# Patient Record
Sex: Male | Born: 1970 | Race: Black or African American | Hispanic: No | Marital: Single | Smoking: Current every day smoker
Health system: Southern US, Community
[De-identification: ages and names within clinical notes are randomized; demographics above are authoritative.]

## PROBLEM LIST (undated history)

## (undated) DIAGNOSIS — F39 Unspecified mood [affective] disorder: Secondary | ICD-10-CM

## (undated) DIAGNOSIS — Q8740 Marfan's syndrome, unspecified: Secondary | ICD-10-CM

## (undated) DIAGNOSIS — F319 Bipolar disorder, unspecified: Secondary | ICD-10-CM

## (undated) DIAGNOSIS — K219 Gastro-esophageal reflux disease without esophagitis: Secondary | ICD-10-CM

## (undated) DIAGNOSIS — F909 Attention-deficit hyperactivity disorder, unspecified type: Secondary | ICD-10-CM

## (undated) HISTORY — DX: Marfan syndrome, unspecified: Q87.40

## (undated) HISTORY — DX: Unspecified mood (affective) disorder: F39

## (undated) HISTORY — DX: Gastro-esophageal reflux disease without esophagitis: K21.9

## (undated) HISTORY — DX: Bipolar disorder, unspecified: F31.9

## (undated) HISTORY — DX: Attention-deficit hyperactivity disorder, unspecified type: F90.9

---
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
## 2005-09-15 ENCOUNTER — Ambulatory Visit (HOSPITAL_COMMUNITY): Admission: RE | Admit: 2005-09-15 | Discharge: 2005-09-15 | Payer: Self-pay | Admitting: Internal Medicine

## 2006-06-15 IMAGING — CR DG HIP W/ PELVIS BILAT
6 series · 6 of 6 positions shown · non-contrast
Comparison: none

HISTORY: Bilateral hip pain, left hip popping, history left hip replacement for
slipped capital femoral epiphysis

BILATERAL HIPS WITH AP PELVIS:
Status post left total hip replacement.
No fracture, dislocation, or bone destruction.
Mineralization normal.
Minimal periprosthetic lucency surrounding tip of femoral component, question
loosening or infection
Soft tissues unremarkable.

[view not recorded (1 of 6)]
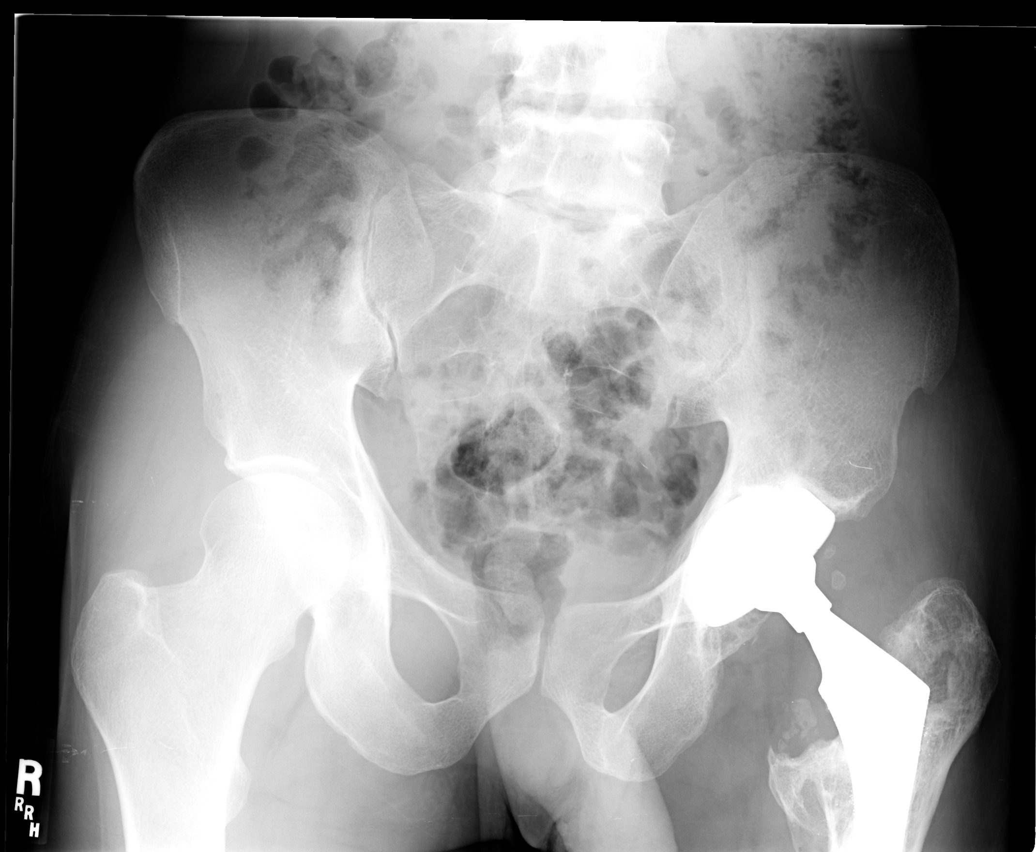

[view not recorded (2 of 6)]
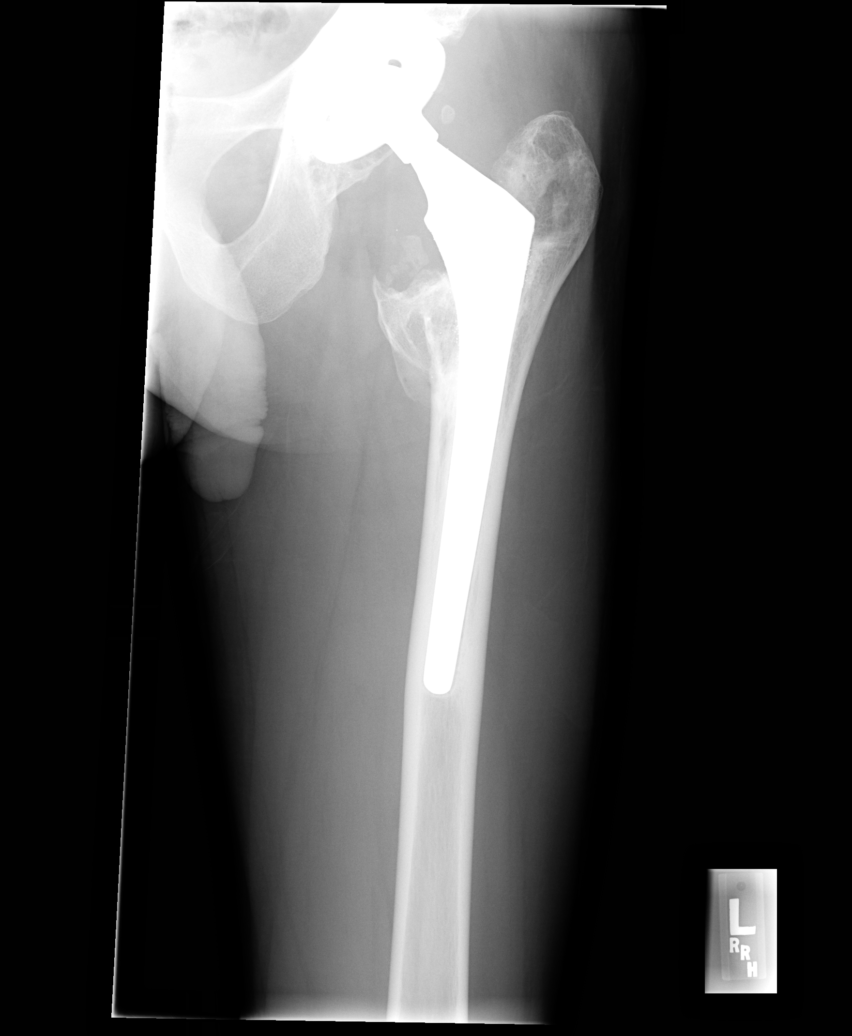

[view not recorded (3 of 6)]
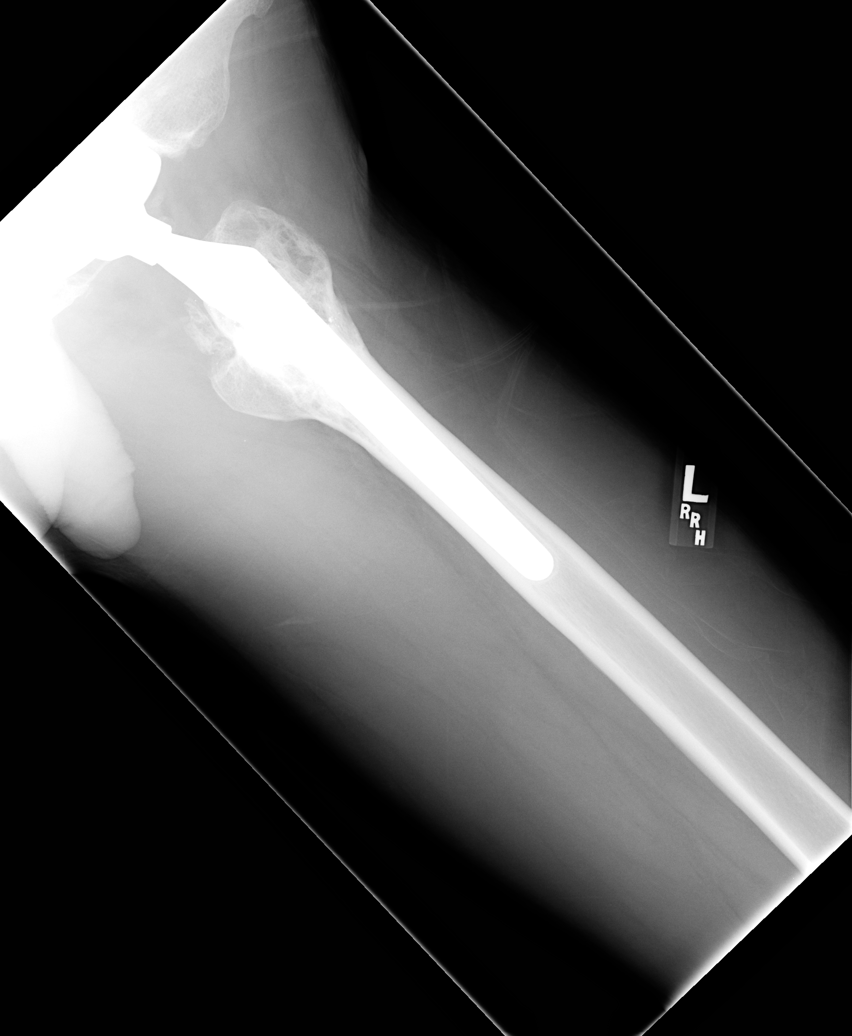

[view not recorded (4 of 6)]
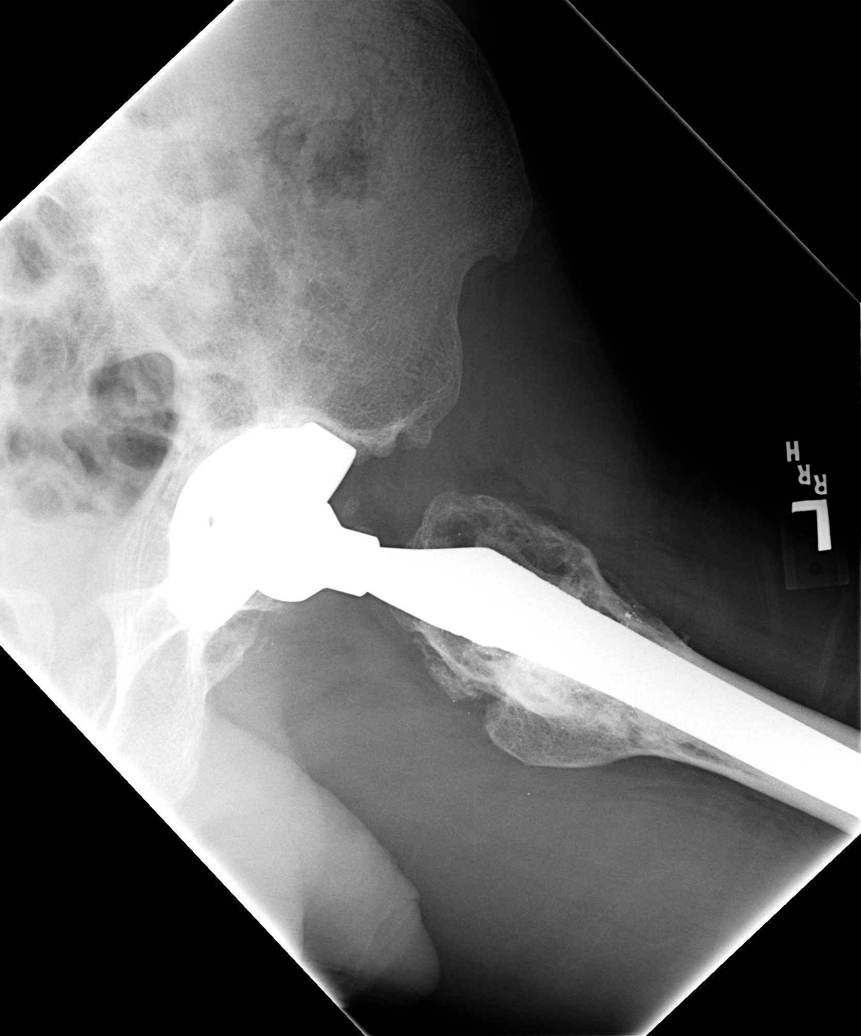

[view not recorded (5 of 6)]
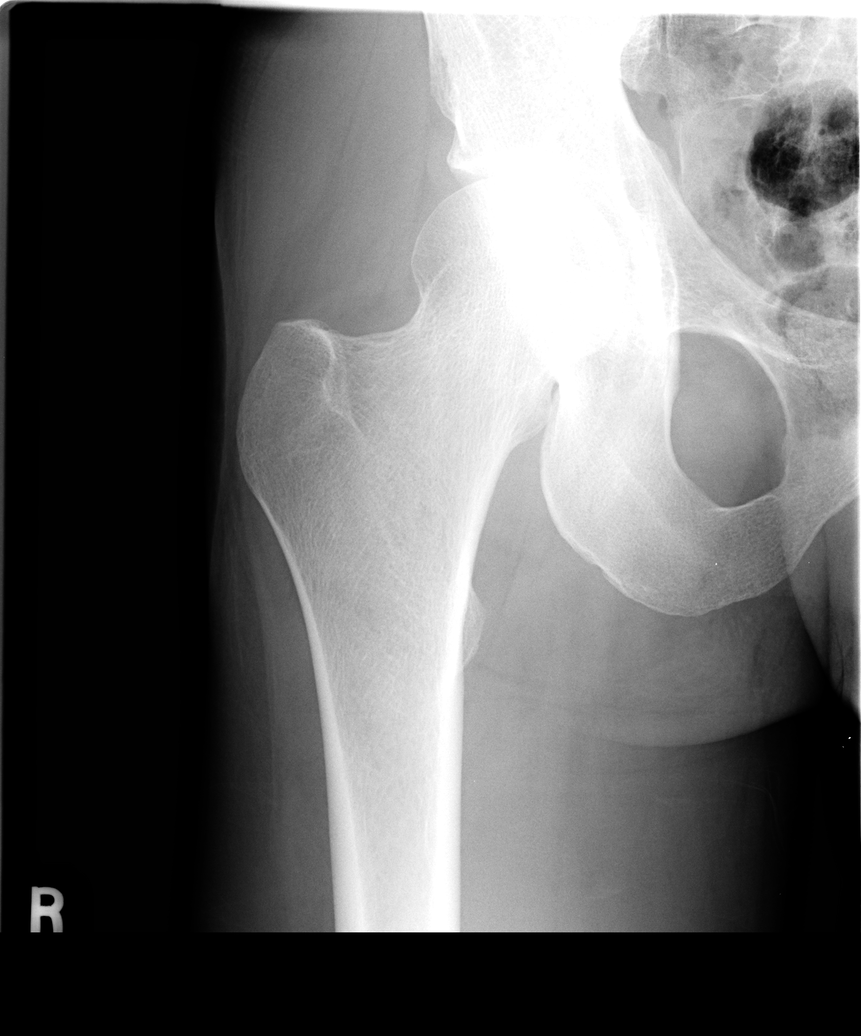

[view not recorded (6 of 6)]
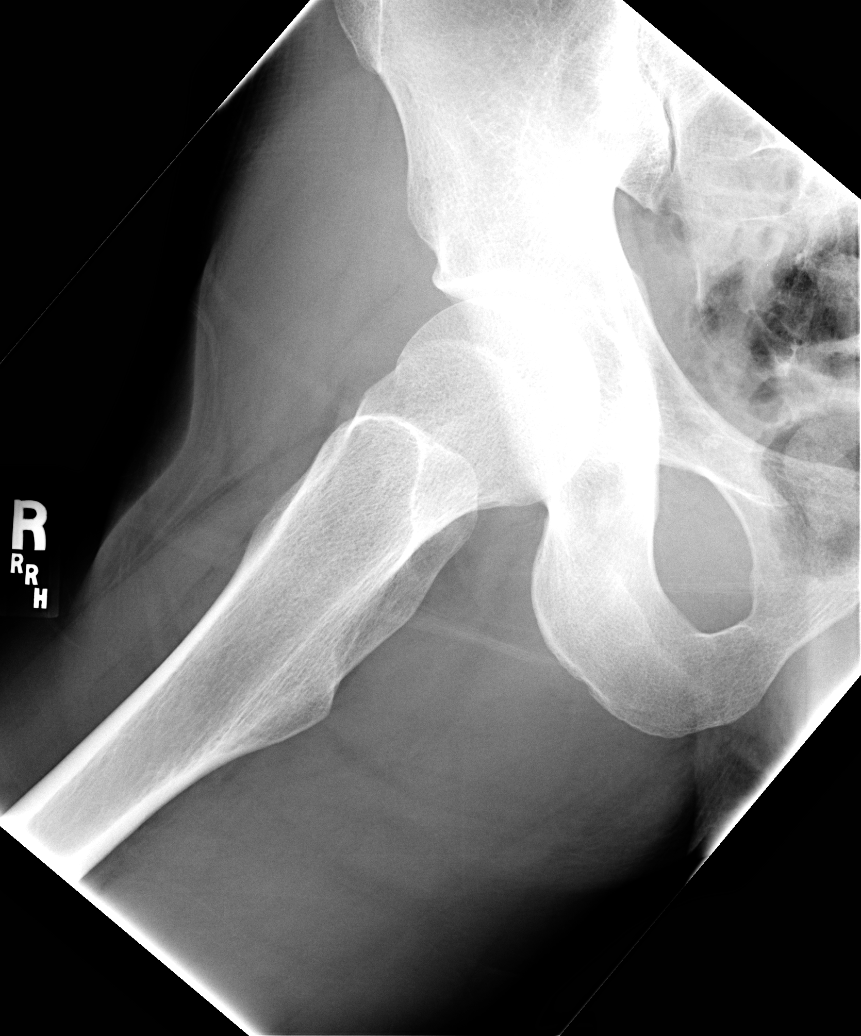

[6 of 6 positions shown; findings below may reference images not displayed]

IMPRESSION: Left hip prosthesis with question loosening though cannot exclude infection.
No other pelvic abnormalities.

## 2023-04-02 ENCOUNTER — Ambulatory Visit (INDEPENDENT_AMBULATORY_CARE_PROVIDER_SITE_OTHER): Payer: Medicaid Other | Admitting: Family Medicine

## 2023-04-02 ENCOUNTER — Encounter: Payer: Self-pay | Admitting: Family Medicine

## 2023-04-02 VITALS — BP 123/103 | HR 69 | Ht 76.5 in | Wt 157.6 lb

## 2023-04-02 DIAGNOSIS — Z23 Encounter for immunization: Secondary | ICD-10-CM

## 2023-04-02 DIAGNOSIS — Z1159 Encounter for screening for other viral diseases: Secondary | ICD-10-CM | POA: Diagnosis not present

## 2023-04-02 DIAGNOSIS — Z1211 Encounter for screening for malignant neoplasm of colon: Secondary | ICD-10-CM | POA: Diagnosis not present

## 2023-04-02 DIAGNOSIS — Z114 Encounter for screening for human immunodeficiency virus [HIV]: Secondary | ICD-10-CM

## 2023-04-02 DIAGNOSIS — Q8740 Marfan's syndrome, unspecified: Secondary | ICD-10-CM

## 2023-04-02 DIAGNOSIS — Z13228 Encounter for screening for other metabolic disorders: Secondary | ICD-10-CM | POA: Diagnosis not present

## 2023-04-02 DIAGNOSIS — J302 Other seasonal allergic rhinitis: Secondary | ICD-10-CM | POA: Diagnosis not present

## 2023-04-02 MED ORDER — FEXOFENADINE HCL 60 MG PO TABS: 60.0000 mg | ORAL_TABLET | Freq: Two times a day (BID) | ORAL | 3 refills | Status: AC

## 2023-04-02 MED ORDER — SHINGRIX 50 MCG/0.5ML IM SUSR: INTRAMUSCULAR | 1 refills | Status: AC

## 2023-04-02 NOTE — Patient Instructions (Addendum)
Therapy and Counseling Resources Most providers on this list will take Medicaid. Patients with commercial insurance or Medicare should contact their insurance company to get a list of in network providers.  [facility] (takes children) Location 1: [address] [place], [place] [address] Location 2: [address] [place], [place] [address] [phone]   [facility] (spanish speaking therapist available)(habla espanol)(take medicare and medicaid)  [address], [place], [place] [address], [email or web address] [email or web address] [phone]  [facility]:Psychiatry and Counseling [address] [facility] [address] [place], [place] [address] [phone]  [facility]   [address], [address] [place], [place] [address]      [phone]  [facility] (spanish available) [address]  [place], [place] [address] [phone]  [facility] (take [facility] and medicare) [address]  [place], [place] [address]       [phone]     [facility] (virtual only) [phone]  [facility]ovita Kussmaul [facility] [address], [place], [place] [phone]  [facility]:  [address] [place] [phone]  [facility]:  [address] [place] [phone]  [facility] (under & uninsured) [address]   [place] [phone]    [email or web address]    [facility] [address]  [place]    [phone]  [facility] -[address]     Phone:  [phone]     [facility]-  [address] [place]  [phone]   [facility] [address]      [place], [place] [phone]  [facility]: [address] [place], [place], [place]  [phone]   [facility] (Spanish therapist) [email or web address]  [address] [place]  [address]    [place] [address]    [phone]    [facility]   [address],  [place], [place]  [phone]   [facility]  [address] [place]  [phone]  [facility]  [address] [place], [place]        [phone]  Open Access/Walk In Clinic under & uninsured  [facility]  [address] [place], [place] [phone] Crisis [phone]  [facility] [place],  (Spanish)   [address], [place]: [phone]) 8:30 - 12; 1 - 2:30  [facility] [employer],  [address] [place], [place]    [phone]):8:30 - 12; 2 - 3PM  [facility] [employer],  [address],  [place]; [phone]):   Mon - Fri 8 AM - 5 PM  [facility] [address] [place]  MWF 12:30 to 3:00 or call to schedule an appointment  [phone]  Specific Provider options Psychology Today  https://www.psychologytoday.com/us click on find a therapist  enter your zip code left side and select or tailor a therapist for your specific need.   [facility] Provider Directory [email or web address]  (Medicaid)   Follow all drop down to find a provider  Social Support program [facility] [phone] or PhotoSolver.pl [address], [place], [place] Recovery support and educational   24- Hour Availability:   [facility]  [address] [place], [place] [phone] Crisis [phone]  [facility] [phone]  [facility]esta Mixer  Crisis Service  [phone]   [facility]  [phone] (after hours)  Therapeutic Alternative/Mobile Crisis   [phone]  [place] National Suicide Hotline  [phone] Len Childs)  Call 911 or go to emergency room  [facility]  [phone]);  [place] and [employer]   [phone]); [place], [place], [place], [place], [place], [place], [place]   It was great to see you today! Thank you for choosing [facility] for your primary care.  You will get a call to schedule your colonoscopy and ophthalmology appointment.  If you haven't already, sign up for My Chart to have easy access to your labs results, and communication with your primary care physician.  We are checking some labs today. If they are abnormal, I will call you. If they are normal, I will send you a MyChart message (if it is active) or a letter in the mail. If you do not hear about your labs in the next 2 weeks, please call the office. I recommend that you always bring your medications to each appointment as this makes it easy to ensure you are on the correct medications and helps [place] not miss refills when you need them.  You should return to our clinic No follow-ups on file. Please arrive 15 minutes before your appointment to ensure smooth check in process.  We appreciate your efforts in making this happen.  Thank you for allowing me to participate in your care,1 Vonna Drafts, MD 04/02/2023, 2:50 PM PGY-1, [facility]

## 2023-04-02 NOTE — Progress Notes (Addendum)
Subjective:  Patient ID: Tyler Patton, male    DOB: 01/24/1971, 52 y.o.   MRN: [id number]  CC: New Patient  HPI:  Tyler Patton is a very pleasant 52 y.o. male who presents today to establish care.  No previous PCP. Requesting eye dr resources.  Current concerns include: Seasonal allergies acting up (runny nose, itchy eyes - currently no symptoms). Has tried zyrtec and claritin without much relief, interested in allegra.   Colonoscopy: not performed, agrees w/ referral Shingles vaccine: agrees w/ prescription  Smoking: 1ppd from 1989 - 2020. Currently smokes about 3 cigarettes per day Drinking: Heavy drinker until about 70yrs ago. Then cut down, now only drinks socially Drugs: THC   PMHx: Past Medical History:  Diagnosis Date   Acid reflux    ADHD    Marfan syndrome    Mood disorder (HCC)    reports bipolar and depression    Surgical Hx: Past Surgical History:  Procedure Laterality Date   TOTAL HIP ARTHROPLASTY Left 1985   revised in 2003    Family Hx: Family History  Problem Relation Age of Onset   Heart Problems Sister        has defibrillator ("rare condition")   Cancer Paternal Grandmother     Social Hx: Current Social History   Who lives at home: mom and sister  Transportation: drives  Religious / Personal Beliefs: n/a    Medications: Not currently taking anything  Reported medications: -Seroquel (last dose about 99mo ago) -OTC allergy medication -PPI?    Objective:  BP (!) 123/103   Pulse 69   Ht 6' 4.5" (1.943 m)   Wt 157 lb 9.6 oz (71.5 kg)   SpO2 100%   BMI 18.93 kg/m  Vitals and nursing note reviewed  General: NAD, pleasant, able to participate in exam HEENT: PERRLA Cardiac: RRR, no murmurs auscultated Respiratory: CTAB, normal WOB Abdomen: soft, non-tender, non-distended, normoactive bowel sounds Extremities: warm and well perfused, no edema or cyanosis Skin: warm and dry, no rashes noted Neuro: alert, no obvious focal  deficits, speech normal, no obvious visual field deficit  Psych: Normal affect and mood  Assessment & Plan:   Overall healthy, Does report a history of Marfan. Will need screening echo and AAA u/s yearly, as well as ophtho exam. Additional screening bloodwork as below. Referring for colonoscopy, provided Rx for shingles vaccine.  Elevated BP (121/91 > recheck 121/103) - will recheck at next visit, pt does not wish to be on many medications at this time. Pending bloodwork may schedule sooner visit.  Provided local therapy/counseling resources.  Marfan syndrome -     ECHOCARDIOGRAM COMPLETE; Future -     VAS [place] AAA DUPLEX; Future -     Ambulatory referral to Ophthalmology  Screening for metabolic disorder -     CBC with Differential/Platelet -     Comprehensive metabolic panel -     Lipid panel -     Hemoglobin A1c  Screening for HIV (human immunodeficiency virus) -     HIV Antibody (routine testing w rflx)  Need for hepatitis C screening test -     HCV Ab w Reflex to Quant PCR  Encounter for screening colonoscopy -     Ambulatory referral to Gastroenterology  Need for shingles vaccine -     Shingrix; Administer Shingrix vaccination now and repeat in two months  Dispense: 1 each; Refill: 1  Seasonal allergies -     Fexofenadine HCl; Take 1 tablet (60  mg total) by mouth 2 (two) times daily.  Dispense: 60 tablet; Refill: 3   Meds ordered this encounter  Medications   Zoster Vaccine Adjuvanted [facility]) injection    Sig: Administer Shingrix vaccination now and repeat in two months    Dispense:  1 each    Refill:  1   fexofenadine (ALLEGRA) 60 MG tablet    Sig: Take 1 tablet (60 mg total) by mouth 2 (two) times daily.    Dispense:  60 tablet    Refill:  3   Return in about 6 months (around 10/02/2023). Vonna Drafts, MD 04/02/2023, 5:09 PM PGY-1, [facility]

## 2023-04-03 LAB — CBC WITH DIFFERENTIAL/PLATELET
Basos: 1 %
Hematocrit: 43.3 % (ref 37.5–51.0)
Immature Grans (Abs): 0 10*3/uL (ref 0.0–0.1)
Lymphs: 38 %
MCHC: 33.3 g/dL (ref 31.5–35.7)
MCV: 85 fL (ref 79–97)
Neutrophils Absolute: 3.4 10*3/uL (ref 1.4–7.0)
Platelets: 230 10*3/uL (ref 150–450)
RBC: 5.11 x10E6/uL (ref 4.14–5.80)
WBC: 6.6 10*3/uL (ref 3.4–10.8)

## 2023-04-03 LAB — COMPREHENSIVE METABOLIC PANEL
ALT: 17 IU/L (ref 0–44)
Albumin: 4.5 g/dL (ref 4.1–5.1)
Alkaline Phosphatase: 74 IU/L (ref 44–121)
Bilirubin Total: 1.1 mg/dL (ref 0.0–1.2)
CO2: 24 mmol/L (ref 20–29)
Chloride: 102 mmol/L (ref 96–106)
Creatinine, Ser: 1 mg/dL (ref 0.76–1.27)
Globulin, Total: 2.2 g/dL (ref 1.5–4.5)
Glucose: 83 mg/dL (ref 70–99)
Total Protein: 6.7 g/dL (ref 6.0–8.5)

## 2023-04-03 LAB — HIV ANTIBODY (ROUTINE TESTING W REFLEX): HIV Screen 4th Generation wRfx: NONREACTIVE

## 2023-04-03 LAB — LIPID PANEL: Triglycerides: 99 mg/dL (ref 0–149)

## 2023-04-03 LAB — HEMOGLOBIN A1C: Est. average glucose Bld gHb Est-mCnc: 114 mg/dL

## 2023-04-07 ENCOUNTER — Encounter: Payer: Self-pay | Admitting: Family Medicine

## 2023-04-07 ENCOUNTER — Telehealth: Payer: Self-pay | Admitting: *Deleted

## 2023-04-07 LAB — HCV INTERPRETATION

## 2023-04-07 LAB — CBC WITH DIFFERENTIAL/PLATELET
Basophils Absolute: 0 10*3/uL (ref 0.0–0.2)
EOS (ABSOLUTE): 0.1 10*3/uL (ref 0.0–0.4)
Eos: 2 %
Hemoglobin: 14.4 g/dL (ref 13.0–17.7)
Immature Granulocytes: 0 %
Lymphocytes Absolute: 2.5 10*3/uL (ref 0.7–3.1)
MCH: 28.2 pg (ref 26.6–33.0)
Monocytes Absolute: 0.5 10*3/uL (ref 0.1–0.9)
Monocytes: 8 %
Neutrophils: 51 %
RDW: 12.2 % (ref 11.6–15.4)

## 2023-04-07 LAB — COMPREHENSIVE METABOLIC PANEL
AST: 22 IU/L (ref 0–40)
Albumin/Globulin Ratio: 2 (ref 1.2–2.2)
BUN/Creatinine Ratio: 11 (ref 9–20)
BUN: 11 mg/dL (ref 6–24)
Calcium: 9.8 mg/dL (ref 8.7–10.2)
Potassium: 4.5 mmol/L (ref 3.5–5.2)
Sodium: 140 mmol/L (ref 134–144)
eGFR: 92 mL/min/{1.73_m2} (ref >59)

## 2023-04-07 LAB — LIPID PANEL
Chol/HDL Ratio: 3 ratio (ref 0.0–5.0)
Cholesterol, Total: 171 mg/dL (ref 100–199)
HDL: 57 mg/dL (ref >39)
LDL Chol Calc (NIH): 96 mg/dL (ref 0–99)
VLDL Cholesterol Cal: 18 mg/dL (ref 5–40)

## 2023-04-07 LAB — HEMOGLOBIN A1C: Hgb A1c MFr Bld: 5.6 % (ref 4.8–5.6)

## 2023-04-07 LAB — HCV AB W REFLEX TO QUANT PCR: HCV Ab: NONREACTIVE

## 2023-04-07 NOTE — Telephone Encounter (Signed)
PA for echo pending with RADMD.  Tracking # [id number].  Office note uploaded.  Tyler Patton,CMA

## 2023-04-15 ENCOUNTER — Telehealth: Payer: Medicaid Other

## 2023-04-15 NOTE — Telephone Encounter (Signed)
Received call in regards to patients scheduled AAA Duplex.  She reports the patient is scheduled for tomorrow for this test.   She reports she is not sure if the test needs pre authorization or not.   Advised I will send to the referral coordinator.  You can call her back at [phone]. Her name is Felicia.

## 2023-04-16 ENCOUNTER — Ambulatory Visit (HOSPITAL_COMMUNITY)
Admission: RE | Admit: 2023-04-16 | Payer: Medicaid Other | Source: Ambulatory Visit | Attending: Family Medicine | Admitting: Family Medicine

## 2023-04-16 ENCOUNTER — Ambulatory Visit (HOSPITAL_COMMUNITY): Payer: Medicaid Other

## 2023-04-16 NOTE — Telephone Encounter (Signed)
Called Tyler Patton and informed her the [place] does not need authorization. Sunday Spillers, CMA

## 2023-04-27 NOTE — Progress Notes (Deleted)
  SUBJECTIVE:   CHIEF COMPLAINT / HPI:   Patient here to discuss medication for alcohol cessation.  Notably, at last visit he was noted to be a heavy drinker up until about 2 years ago at which point he cut down.  At that time, stated that he only drinks socially.  Patient is also currently a smoker, smokes about 3 cigarettes/day.  Patient has AAA and echo screening scheduled for 7/5 at 3:30 PM. ***  PERTINENT  PMH / PSH: ***  Past Medical History:  Diagnosis Date   Acid reflux    ADHD    Marfan syndrome    Mood disorder (HCC)    reports bipolar and depression    OBJECTIVE:  There were no vitals taken for this visit.  General: NAD, pleasant, able to participate in exam Cardiac: RRR, no murmurs auscultated Respiratory: CTAB, normal WOB Abdomen: soft, non-tender, non-distended, normoactive bowel sounds Extremities: warm and well perfused, no edema or cyanosis Skin: warm and dry, no rashes noted Neuro: alert, no obvious focal deficits, speech normal Psych: Normal affect and mood  ASSESSMENT/PLAN:   There are no diagnoses linked to this encounter. No orders of the defined types were placed in this encounter.  No follow-ups on file.  Vonna Drafts, MD [facility] Residency

## 2023-04-28 ENCOUNTER — Ambulatory Visit: Payer: Medicaid Other | Admitting: Family Medicine

## 2023-05-01 ENCOUNTER — Ambulatory Visit (HOSPITAL_BASED_OUTPATIENT_CLINIC_OR_DEPARTMENT_OTHER)
Admission: RE | Admit: 2023-05-01 | Discharge: 2023-05-01 | Disposition: A | Discharge status: Home / Self Care | Payer: Medicaid Other | Source: Ambulatory Visit | Attending: Family Medicine | Admitting: Family Medicine

## 2023-05-01 ENCOUNTER — Ambulatory Visit (HOSPITAL_COMMUNITY)
Admission: RE | Admit: 2023-05-01 | Discharge: 2023-05-01 | Disposition: A | Discharge status: Home / Self Care | Payer: Medicaid Other | Source: Ambulatory Visit | Attending: Family Medicine | Admitting: Family Medicine

## 2023-05-01 DIAGNOSIS — F17200 Nicotine dependence, unspecified, uncomplicated: Secondary | ICD-10-CM | POA: Diagnosis not present

## 2023-05-01 DIAGNOSIS — I10 Essential (primary) hypertension: Secondary | ICD-10-CM | POA: Insufficient documentation

## 2023-05-01 DIAGNOSIS — Z136 Encounter for screening for cardiovascular disorders: Secondary | ICD-10-CM | POA: Insufficient documentation

## 2023-05-01 DIAGNOSIS — Q8740 Marfan's syndrome, unspecified: Secondary | ICD-10-CM

## 2023-05-01 LAB — ECHOCARDIOGRAM COMPLETE
AR max vel: 2.53 cm2
AV Area VTI: 2.56 cm2
AV Area mean vel: 2.45 cm2
AV Mean grad: 3 mmHg
AV Peak grad: 5 mmHg
Ao pk vel: 1.12 m/s
Area-P 1/2: 5.16 cm2
S' Lateral: 4.1 cm

## 2023-05-05 ENCOUNTER — Telehealth: Payer: Self-pay | Admitting: Family Medicine

## 2023-05-05 DIAGNOSIS — R931 Abnormal findings on diagnostic imaging of heart and coronary circulation: Secondary | ICD-10-CM

## 2023-05-05 NOTE — Telephone Encounter (Signed)
Attempted to call patient to discuss echocardiogram results.  No answer, left voicemail and instructed patient to call back.  Screening echocardiogram was done and showed a mildly reduced ejection fraction of 40 to 45% with left ventricle global hypokinesis.  He did not report any history of heart disease and was asymptomatic during her visit, so I am not particularly concerned about this in the acute setting, but I would like for him to be evaluated by a cardiologist who may decide to get additional imaging given his history of Marfan.  Placing cardiology referral  AAA screening was unremarkable and did not show evidence of any aortic aneurysm.

## 2023-05-26 ENCOUNTER — Ambulatory Visit (AMBULATORY_SURGERY_CENTER): Payer: Medicaid Other

## 2023-05-26 VITALS — Ht 76.5 in | Wt 155.0 lb

## 2023-05-26 DIAGNOSIS — Z1211 Encounter for screening for malignant neoplasm of colon: Secondary | ICD-10-CM

## 2023-05-26 NOTE — Progress Notes (Signed)
No egg or soy allergy known to patient  No issues known to pt with past sedation with any surgeries or procedures Patient denies ever being told they had issues or difficulty with intubation  No FH of Malignant Hyperthermia Pt is not on diet pills Pt is not on  home 02  Pt is not on blood thinners  Pt denies issues with constipation  No A fib or A flutter Have any cardiac testing pending--no Pt can ambulate with cain Pt denies use of chewing tobacco Discussed diabetic I weight loss medication holds Discussed NSAID holds Checked BMI Pt instructed to use Singlecare.com or GoodRx for a price reduction on prep  Patient's chart reviewed by Cathlyn Parsons CNRA prior to previsit and patient appropriate for the [facility].  Pre visit completed and red dot placed by patient's name on their procedure day (on provider's schedule).

## 2023-06-11 ENCOUNTER — Telehealth: Payer: Self-pay | Admitting: Gastroenterology

## 2023-06-11 ENCOUNTER — Encounter: Payer: Medicaid Other | Admitting: Gastroenterology

## 2023-06-11 NOTE — Telephone Encounter (Signed)
Hi Dr Tomasa Rand,   I called this patient regarding his procedure appointment today , he stated I tired calling to reschedule but his phone service was down and he wasn't able to pick up his prep medication. Patient states he will call back to reschedule. I will no show him for today's appointment.   Thank you

## 2023-07-21 ENCOUNTER — Ambulatory Visit: Payer: Medicaid Other | Admitting: Internal Medicine

## 2023-08-11 ENCOUNTER — Encounter: Payer: Medicaid Other | Admitting: Family Medicine

## 2023-08-18 ENCOUNTER — Ambulatory Visit (INDEPENDENT_AMBULATORY_CARE_PROVIDER_SITE_OTHER): Payer: Medicaid Other | Admitting: Family Medicine

## 2023-08-18 ENCOUNTER — Encounter: Payer: Self-pay | Admitting: Family Medicine

## 2023-08-18 VITALS — BP 116/68 | HR 71 | Ht 76.0 in | Wt 140.0 lb

## 2023-08-18 DIAGNOSIS — Q8740 Marfan's syndrome, unspecified: Secondary | ICD-10-CM | POA: Insufficient documentation

## 2023-08-18 DIAGNOSIS — R634 Abnormal weight loss: Secondary | ICD-10-CM

## 2023-08-18 DIAGNOSIS — F39 Unspecified mood [affective] disorder: Secondary | ICD-10-CM | POA: Diagnosis not present

## 2023-08-18 DIAGNOSIS — K219 Gastro-esophageal reflux disease without esophagitis: Secondary | ICD-10-CM | POA: Insufficient documentation

## 2023-08-18 DIAGNOSIS — F909 Attention-deficit hyperactivity disorder, unspecified type: Secondary | ICD-10-CM | POA: Insufficient documentation

## 2023-08-18 NOTE — Patient Instructions (Addendum)
Please call [phone] to schedule your cardiologist appointment Please call [phone] to schedule your colonoscopy  If any of your results from today are abnormal I will give you a call.  Otherwise I will send a message on MyChart.   Psychiatry Resource List (Adults and Children) Most of these providers will take Medicaid. please consult your insurance for a complete and updated list of available providers. When calling to make an appointment have your insurance information available to confirm you are covered.   [facility]:Psychiatry and Counseling [address] [facility] [address] [place], [place] [address] [phone]  [facility]  [address] [place], [place] [phone] Crisis [phone]   [facility]:   [facility]: [address]     [phone]   [facility]: [address] [place]. [place],        [phone] [place]: [address] [phone],    [phone] [place]: [phone] [address],                   [phone] Children: [facility] [address]         [phone]  [facility] (virtual only) [phone]   [facility]  (Psychiatry only; Adults /children 12 and over, will take Medicaid)  [address] [place]urell Josephs [address], [place], [place] [address]       [phone]   [facility] (Psychiatry & counseling ; adults & children ; will take Medicaid [address]  [place] [address]  Go on-line to complete referral ( https://www.[email or web address].org/en/make-[email or web address]-referral [phone]    (Spanish speaking therapists)  [facility]  Psychiatry & counseling; Adults and children;  Call Registration prior to scheduling an appointment [phone] [address] [facility] [address]    [place], [place] [address]    [phone]  [facility] (Psychiatry & counseling; adults & children; Medicare no  Medicaid)  [address]   [place], [place]  [address]      [phone]    [facility] (up to age 55)  Psychiatry & counseling ,will take Medicaid, must do counseling to receive psychiatry services  [address] [place] [address]        [phone]  [facility] (Psychiatry & counseling; adults & children; will take Medicaid) Will need a referral from provider [address],  [place], [place]  [phone]   [facility] --- Walk-In Mon-Friday 8am-3pm ( will take Medicaid, Psychiatry, Adults & children,  [address], [place], [place]   [phone]   [facility] [place]--, Walk-in M-F 8am-12pm and 1pm -3pm   (Counseling, Psychiatry, will take Medicaid, adults & children)  [address] [place], [place]  [phone]

## 2023-08-18 NOTE — Progress Notes (Signed)
    SUBJECTIVE:   CHIEF COMPLAINT / HPI:   Establish care in June 2024, at that visit noted to have history of Marfan syndrome.  Screening echocardiogram was done and showed mildly reduced EF 40 to 45% with LV global hypokinesis, no AAA noted screening.  He was referred to see cardiology but this appointment was canceled.  Has lost weight - 17lbs since June 2024 (4 months). This is unintentional, has actually been trying to gain weight. His appetite has gone down a lot over the past - feels like it's due to depression Does endorse some night sweats. Denies heavy snoring, apnea at night Denies chronic pain aside from some pain in his hip that comes and goes- had a hip replacement 20+ years ago Lives with his sister. Has a girlfriend. Doesn't feel like he has a good social circle. Interested in therapy Denies SI/HI Smokes 5 cigarettes/day. Has smoked since age 17, used to smoke 2ppd  Requesting excuse from jury duty due to inability to sit for long periods of time given his chronic hip pain and sequelae from hip replacement   PERTINENT  PMH / PSH: reported hx bipolar, hx marfan, hx hip replacement  OBJECTIVE:   BP 116/68   Pulse 71   Ht 6\' 4"  (1.93 m)   Wt 140 lb (63.5 kg)   SpO2 98%   BMI 17.04 kg/m    General: NAD, pleasant, able to participate in exam.  Marfanoid appearance Cardiac: RRR, no murmurs auscultated Respiratory: CTAB, normal WOB Abdomen: soft, non-tender, non-distended, normoactive bowel sounds Extremities: warm and well perfused, no edema or cyanosis Skin: warm and dry, no rashes noted Neuro: alert, no obvious focal deficits, speech normal Psych: Normal affect and mood  ASSESSMENT/PLAN:   Assessment & Plan Weight loss Most likely related to low appetite in the setting of mood disorder.  Placed referral to psychiatry and provided list of Medicaid counseling/therapy resources as well.  Obtain TSH to rule out thyroid etiology.  Had normal CBC with differential,  CMP, lipid panel, A1c in June 2024.  Also negative for hep C and HIV at that time.  Does have some significant smoking history but without any pulmonary symptoms at this time, may consider further workup for neoplastic cause of his symptoms if no improvement.  Advised to schedule colonoscopy for colon cancer screening. Mood disorder (HCC) Reported history of bipolar disorder, reports that he used to see a psychiatrist in [place].  Placed referral to psychiatry and provided list of local therapy/counseling resources.  Provided phone number for cardiologist to reschedule appt  Provided number for [facility] to schedule colonoscopy Provided list of therapy resources for medicaid Provided note for jury duty excuse in the setting of his chronic hip pain and mobility limitations  Follow-up in 3 months  Vonna Drafts, MD [facility]

## 2023-08-18 NOTE — Assessment & Plan Note (Addendum)
Reported history of bipolar disorder, reports that he used to see a psychiatrist in [place].  Placed referral to psychiatry and provided list of local therapy/counseling resources.

## 2023-08-19 LAB — TSH RFX ON ABNORMAL TO FREE T4: TSH: 0.538 u[IU]/mL (ref 0.450–4.500)

## 2023-08-20 ENCOUNTER — Encounter: Payer: Self-pay | Admitting: Family Medicine

## 2023-09-09 ENCOUNTER — Ambulatory Visit (HOSPITAL_COMMUNITY)
Admission: EM | Admit: 2023-09-09 | Discharge: 2023-09-09 | Disposition: A | Discharge status: Home / Self Care | Payer: Medicaid Other

## 2023-09-09 NOTE — Discharge Summary (Signed)
Pt left AMA °

## 2023-11-18 ENCOUNTER — Ambulatory Visit: Payer: Medicaid Other | Admitting: Family Medicine

## 2023-11-18 NOTE — Progress Notes (Deleted)
    SUBJECTIVE:   CHIEF COMPLAINT / HPI:   Seen 10/22 At that time was advised to follow-up with cardiology, GI, psychiatry ***  PERTINENT  PMH / PSH: ***  OBJECTIVE:   There were no vitals taken for this visit.  ***  ASSESSMENT/PLAN:   Assessment & Plan    Vonna Drafts, MD [facility]

## 2023-11-24 ENCOUNTER — Ambulatory Visit (INDEPENDENT_AMBULATORY_CARE_PROVIDER_SITE_OTHER): Payer: Medicaid Other | Admitting: Licensed Clinical Social Worker

## 2023-11-24 ENCOUNTER — Encounter (HOSPITAL_COMMUNITY): Payer: Self-pay | Admitting: Licensed Clinical Social Worker

## 2023-11-24 DIAGNOSIS — F39 Unspecified mood [affective] disorder: Secondary | ICD-10-CM | POA: Diagnosis not present

## 2023-11-24 DIAGNOSIS — F1210 Cannabis abuse, uncomplicated: Secondary | ICD-10-CM | POA: Insufficient documentation

## 2023-11-24 NOTE — Progress Notes (Signed)
Comprehensive Clinical Assessment (CCA) Note  11/24/2023 Tyler Patton [id number]  Chief Complaint:  Chief Complaint  Patient presents with   ADHD   Depression   Anxiety   Visit Diagnosis: Mood disorder unspecified   Client is a 53 year old male. Client is referred by [facility] PCP for a stress management.   Client states mental health symptoms as evidenced by:   Depression Change in energy/activity; Hopelessness; Worthlessness; Irritability; Sleep (too much or little) Change in energy/activity; Hopelessness; Worthlessness; Irritability; Sleep (too much or little)  Duration of Depressive Symptoms Greater than two weeks Greater than two weeks  Mania Increased Energy Increased Energy  Anxiety Tension; Worrying; Fatigue; Restlessness; Sleep; Irritability; Difficulty concentrating Tension; Worrying; Fatigue; Restlessness; Sleep; Irritability; Difficulty concentrating  Psychosis None None  Trauma None None  Obsessions None None  Compulsions None None  Inattention None None  Hyperactivity/Impulsivity None None  Oppositional/Defiant Behaviors None None  Emotional Irregularity None None   Client denies suicidal and homicidal ideations at this time Client denies hallucinations and delusions at this time  Client was screened for the following SDOH: smoking, food, transportation , stress/tension, social interactions, PHQ-9   Assessment Information that integrates subjective and objective details with a therapist's professional interpretation:     Interval he was alert and oriented x 5.  He was pleasant, cooperative, maintained good eye contact.  He engaged well in therapy session was dressed casually.  Patient comes in today as a referral from primary care physician through [facility].  He reports stressors for housing, financials, depression, boredom, and marijuana use.  Patient reports that he does not feel like he has a substance abuse problem with marijuana although he does  smoke it weekly.  He reports that he is a very educated man and has been in college since he was 53 years old.  Tryton reports no degrees to date but is about 15 credit hours away from graduating.  Patient reports his current housing situation is with his sister after last year his landlord and him had a disagreement.  Patient states today "my landlord was not asshole".  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  He endorses symptoms for irritability, tension, worry, worthlessness, hopelessness, and insomnia.  Coston reports conflict resolution is one of his primary goals for therapy.  LCSW recommended therapy 1 time monthly.     Client states use of the following substances: Marijuana abuse current    Client was in agreement with treatment recommendations.    CCA Screening, Triage and Referral (STR)  Patient Reported Information Referral name: [place]  Whom do you see for routine medical problems? Primary Care  Practice/Facility Name: Vonna Drafts, MD through [facility]  How Long Has This Been Causing You Problems? > than 6 months  What Do You Feel Would Help You the Most Today? Treatment for Depression or other mood problem; Stress Management  Have You Recently Been in Any Inpatient Treatment (Hospital/Detox/Crisis Center/28-Day Program)? No  Have You Ever Received Services From [employer] Before? Yes  Who Do You See at [facility]? PCP  Have You Recently Had Any Thoughts About Hurting Yourself? No  Are You Planning to Commit Suicide/Harm Yourself At This time? No  Have you Recently Had Thoughts About Hurting Someone Karolee Ohs? No  Have You Used Any Alcohol or Drugs in the Past 24 Hours? No  Do You Currently Have a Therapist/Psychiatrist? No   Have You Been Recently  Discharged From Any Office Practice or Programs? No   CCA Screening Triage Referral Assessment Type of Contact: Face-to-Face  Is CPS involved or ever  been involved? Never  Is APS involved or ever been involved? Never   Patient Determined To Be At Risk for Harm To Self or Others Based on Review of Patient Reported Information or Presenting Complaint? No  Method: No Plan  Availability of Means: No access or NA  Intent: Vague intent or NA  Notification Required: No need or identified person  Are There Guns or Other Weapons in Your Home? No  Are These Weapons Safely Secured?                            No  Location of Assessment: [facility]  Does Patient Present under Involuntary Commitment? No  [place] of Residence: [place]  CCA Biopsychosocial Intake/Chief Complaint:  PCP referred him to be able to talk someone to vent.  Current Symptoms/Problems: insomnia, depression, lack of motivation, rapid thoughts,  Patient Reported Schizophrenia/Schizoaffective Diagnosis in Past: No  Strengths: willing to engage in treatment  Preferences: therapy  Abilities: youtube influencer, prduce music, read a lot,   Type of Services Patient Feels are Needed: therapy   Mental Health Symptoms Depression:  Change in energy/activity; Hopelessness; Worthlessness; Irritability; Sleep (too much or little)   Duration of Depressive symptoms: Greater than two weeks   Mania:  Increased Energy   Anxiety:   Tension; Worrying; Fatigue; Restlessness; Sleep; Irritability; Difficulty concentrating   Psychosis:  None   Duration of Psychotic symptoms: No data recorded  Trauma:  None   Obsessions:  None   Compulsions:  None   Inattention:  None   Hyperactivity/Impulsivity:  None   Oppositional/Defiant Behaviors:  None   Emotional Irregularity:  None   Other Mood/Personality Symptoms:  No data recorded   Mental Status Exam Appearance and self-care  Stature:  Average   Weight:  Average weight   Clothing:  Casual   Grooming:  Normal   Cosmetic use:  No data recorded  Posture/gait:  Normal   Motor activity:  Not  Remarkable   Sensorium  Attention:  Normal   Concentration:  Normal   Orientation:  X5   Recall/memory:  Normal   Affect and Mood  Affect:  Anxious; Depressed   Mood:  Depressed; Anxious   Relating  Eye contact:  Normal   Facial expression:  Anxious; Depressed   Attitude toward examiner:  Cooperative   Thought and Language  Speech flow: Clear and Coherent   Thought content:  Appropriate to Mood and Circumstances   Preoccupation:  None   Hallucinations:  None   Organization:  No data recorded  [employer] of Knowledge:  Fair   Intelligence:  Average   Abstraction:  No data recorded  Judgement:  Fair   Dance movement psychotherapist:  Realistic   Insight:  Fair   Decision Making:  Normal   Social Functioning  Social Maturity:  Isolates   Social Judgement:  Normal   Stress  Stressors:  Housing; Grief/losses; Illness; Financial; Relationship   Coping Ability:  Normal   Skill Deficits:  Communication; Interpersonal; Self-control   Supports:  Church; Family; Friends/Service system     Religion: Religion/Spirituality Are You A Religious Person?: Yes What is Your Religious Affiliation?: Environmental consultant: Leisure / Recreation Do You Have Hobbies?: Yes Leisure and Hobbies: music producing, Risk analyst,  Exercise/Diet: Exercise/Diet Do  You Exercise?: No Have You Gained or Lost A Significant Amount of Weight in the Past Six Months?: No Do You Follow a Special Diet?: No Do You Have Any Trouble Sleeping?: Yes Explanation of Sleeping Difficulties: falling and staying asleep   CCA Employment/Education Employment/Work Situation: Employment / Work Situation Employment Situation: On disability Why is Patient on Disability: Hip problems How Long has Patient Been on Disability: since 53 years old Patient's Job has Been Impacted by Current Illness: No Has Patient ever Been in the [employer]?: No  Education: Education Is Patient  Currently Attending School?: No Did Garment/textile technologist From [employer]?: Yes Did Theme park manager?: Yes What Type of College Degree Do you Have?: have not finished Did You Attend Graduate School?: No Did You Have An Individualized Education Program (IIEP): No Did You Have Any Difficulty At School?: Yes (learning disability) Were Any Medications Ever Prescribed For These Difficulties?: No Patient's Education Has Been Impacted by Current Illness: No   CCA Family/Childhood History Family and Relationship History: Family history Marital status: Single Are you sexually active?: No What is your sexual orientation?: hetrdsexual Does patient have children?: Yes How many children?: 1 How is patient's relationship with their children?: "Sketchy"  Childhood History:  Childhood History By whom was/is the patient raised?: Mother Description of patient's relationship with caregiver when they were a child: good Patient's description of current relationship with people who raised him/her: up and down How were you disciplined when you got in trouble as a child/adolescent?: whoopings Does patient have siblings?: Yes Number of Siblings: 1 Description of patient's current relationship with siblings: good Did patient suffer any verbal/emotional/physical/sexual abuse as a child?: No Did patient suffer from severe childhood neglect?: No Has patient ever been sexually abused/assaulted/raped as an adolescent or adult?: No Was the patient ever a victim of a crime or a disaster?: No Witnessed domestic violence?: No Has patient been affected by domestic violence as an adult?: No  Child/Adolescent Assessment:     CCA Substance Use Alcohol/Drug Use: Alcohol / Drug Use History of alcohol / drug use?: Yes Substance #1 Name of Substance 1: Marijuana 1 - Amount (size/oz): 5 blunts 1 - Frequency: 1 to 2 blunts weekly 1 - Method of Aquiring: gets in [place]    DSM5 Diagnoses: Patient Active Problem List    Diagnosis Date Noted   Marfan syndrome    Mood disorder (HCC)    Acid reflux    ADHD     Referrals to Alternative Service(s): Referred to Alternative Service(s):   Place:   Date:   Time:    Referred to Alternative Service(s):   Place:   Date:   Time:    Referred to Alternative Service(s):   Place:   Date:   Time:    Referred to Alternative Service(s):   Place:   Date:   Time:      Collaboration of Care: Other None today   Patient/Guardian was advised Release of Information must be obtained prior to any record release in order to collaborate their care with an outside provider. Patient/Guardian was advised if they have not already done so to contact the registration department to sign all necessary forms in order for [place] to release information regarding their care.   Consent: Patient/Guardian gives verbal consent for treatment and assignment of benefits for services provided during this visit. Patient/Guardian expressed understanding and agreed to proceed.   [name], LCSW

## 2023-12-15 ENCOUNTER — Ambulatory Visit (INDEPENDENT_AMBULATORY_CARE_PROVIDER_SITE_OTHER): Payer: Medicaid Other | Admitting: Licensed Clinical Social Worker

## 2023-12-15 DIAGNOSIS — F39 Unspecified mood [affective] disorder: Secondary | ICD-10-CM

## 2023-12-15 NOTE — Progress Notes (Signed)
THERAPIST PROGRESS NOTE  Virtual Visit via Video Note  I connected with Tyler Patton on 12/15/23 at  8:00 AM EST by a video enabled telemedicine application and verified that I am speaking with the correct person using two identifiers.  Location: Patient: [facility]  Provider: Provider Home    I discussed the limitations of evaluation and management by telemedicine and the availability of in person appointments. The patient expressed understanding and agreed to proceed.    I discussed the assessment and treatment plan with the patient. The patient was provided an opportunity to ask questions and all were answered. The patient agreed with the plan and demonstrated an understanding of the instructions.   The patient was advised to call back or seek an in-person evaluation if the symptoms worsen or if the condition fails to improve as anticipated.  I provided 30 minutes of non-face-to-face time during this encounter.   [name], LCSW   Participation Level: Active  Behavioral Response: CasualAlertAnxious and Depressed  Type of Therapy: Individual Therapy  Treatment Goals addressed:  Active     OP Depression     LTG: Reduce frequency, intensity, and duration of depression symptoms so that daily functioning is improved (Progressing)     Start:  11/24/23    Expected End:  05/26/24         LTG: Increase coping skills to manage depression and improve ability to perform daily activities (Progressing)     Start:  11/24/23    Expected End:  05/26/24         STG: Tyler Patton will participate in at least 80% of scheduled individual psychotherapy sessions  (Progressing)     Start:  11/24/23    Expected End:  05/26/24         Identify 3 triggers for depression (Progressing)     Start:  11/24/23    Expected End:  05/26/24         STG: Identify 3 coping skills for depression (Progressing)     Start:  11/24/23    Expected End:  05/26/24         Work with [employer]  to track symptoms, triggers, and/or skill use through a mood chart, diary card, or journal     Start:  11/24/23         Encourage Tyler Patton to participate in recovery peer support activities weekly      Start:  11/24/23         Therapist will administer the PHQ-9 at weekly intervals for the next 8 weeks     Start:  11/24/23         Provide [name] and reading material on dissociation, its causes, and symptoms     Start:  11/24/23         Work with [name] to identify the major components of a recent episode of depression: physical symptoms, major thoughts and images, and major behaviors they experienced     Start:  11/24/23            ProgressTowards Goals: Progressing  Interventions: CBT and Motivational Interviewing  Summary: Tyler Patton is a 53 y.o. male who presents with depressed and anxious mood\affect.  Patient was pleasant, cooperative, maintained good eye contact.  He engaged well in therapy session was dressed casually.  Patient reports today that overall his mental health has been good but reports seasonal depression.  He states today that a triggering event for him is multiple days of  rain, clouds, or poor weather.  He reports that he isolates himself more and does not have as much motivation to do things.  Patient reports that one of the things he would like to improve on his socialization.  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  Patient reports that he is not currently earning money but is close to meeting the criteria to monitor ties his videos.  Tyler Patton reports walking daily up to 2 miles.  He reports that he would like to improve on his physical health by exercising more often.  Suicidal/Homicidal: Nowithout intent/plan  Therapist Response:     Intervention/plan: LCSW utilized psychoanalytic therapy for patient to express thoughts, feelings and concerns in session utilizing nonjudgmental  stance.  LCSW utilized supportive therapy for praise and encouragement.  LCSW utilized person centered therapy for empowerment.  LCSW spoke with patient about triggers for depression such as poor weather.  LCSW spoke about coping skills for patient such as working out, nature walks, and support groups.  LCSW utilized solution focused therapy by providing patient resources via email to local support groups in the area for [facility] and [employer]: Return again in 4 weeks.  Diagnosis: Mood disorder (HCC)  Collaboration of Care: Other None today   Patient/Guardian was advised Release of Information must be obtained prior to any record release in order to collaborate their care with an outside provider. Patient/Guardian was advised if they have not already done so to contact the registration department to sign all necessary forms in order for [place] to release information regarding their care.   Consent: Patient/Guardian gives verbal consent for treatment and assignment of benefits for services provided during this visit. Patient/Guardian expressed understanding and agreed to proceed.   [name], LCSW 12/15/2023

## 2024-01-05 ENCOUNTER — Ambulatory Visit (HOSPITAL_COMMUNITY): Payer: Medicaid Other | Admitting: Licensed Clinical Social Worker

## 2024-01-05 ENCOUNTER — Ambulatory Visit: Admitting: Family Medicine

## 2024-01-05 ENCOUNTER — Encounter (HOSPITAL_COMMUNITY): Payer: Self-pay

## 2024-02-02 ENCOUNTER — Ambulatory Visit (INDEPENDENT_AMBULATORY_CARE_PROVIDER_SITE_OTHER): Payer: Medicaid Other | Admitting: Licensed Clinical Social Worker

## 2024-02-02 DIAGNOSIS — F1210 Cannabis abuse, uncomplicated: Secondary | ICD-10-CM

## 2024-02-02 DIAGNOSIS — F39 Unspecified mood [affective] disorder: Secondary | ICD-10-CM

## 2024-02-02 NOTE — Progress Notes (Signed)
 THERAPIST PROGRESS NOTE  Virtual Visit via Video Note  I connected with Tyler Patton on 02/02/24 at  9:00 AM EDT by a video enabled telemedicine application and verified that I am speaking with the correct person using two identifiers.  [place]  Provider: [place] [facility]    I discussed the limitations of evaluation and management by telemedicine and the availability of in person appointments. The patient expressed understanding and agreed to proceed.    I discussed the assessment and treatment plan with the patient. The patient was provided an opportunity to ask questions and all were answered. The patient agreed with the plan and demonstrated an understanding of the instructions.   The patient was advised to call back or seek an in-person evaluation if the symptoms worsen or if the condition fails to improve as anticipated.  I provided 20 minutes of non-face-to-face time during this encounter.   Tyler Cooks, LCSW   Participation Level: Active  Behavioral Response: CasualAlertAnxious and Depressed  Type of Therapy: Individual Therapy  Treatment Goals addressed:  Active     OP Depression     LTG: Reduce frequency, intensity, and duration of depression symptoms so that daily functioning is improved (Progressing)     Start:  11/24/23    Expected End:  05/26/24         LTG: Increase coping skills to manage depression and improve ability to perform daily activities (Progressing)     Start:  11/24/23    Expected End:  05/26/24         STG: Tyler Patton will participate in at least 80% of scheduled individual psychotherapy sessions  (Progressing)     Start:  11/24/23    Expected End:  05/26/24         Identify 3 triggers for depression (Progressing)     Start:  11/24/23    Expected End:  05/26/24         STG: Identify 3 coping skills for depression (Progressing)     Start:  11/24/23    Expected End:  05/26/24         Work with  Tyler Patton to track symptoms, triggers, and/or skill use through a mood chart, diary card, or journal     Start:  11/24/23         Encourage Tyler Patton to participate in recovery peer support activities weekly      Start:  11/24/23         Therapist will administer the PHQ-9 at weekly intervals for the next 8 weeks     Start:  11/24/23         Provide Tyler Patton and reading material on dissociation, its causes, and symptoms     Start:  11/24/23         Work with Tyler Patton to identify the major components of a recent episode of depression: physical symptoms, major thoughts and images, and major behaviors they experienced     Start:  11/24/23            ProgressTowards Goals: Progressing  Interventions: CBT, Motivational Interviewing, and Supportive   Suicidal/Homicidal: Nowithout intent/plan  Therapist Response:   Patient was alert and oriented x 5. He was pleasant, cooperative, maintained good eye contact. He engaged well in therapy session and was dressed casually. Tyler Patton presented with anxious and depressed mood\affect.  Patient reports today that he is going "backwards with his ex-girlfriend". He reports that he got back together with her for  the third time, and it only lasted 1 week. He reports that "people with bipolar disorder and ADHD do not mix". Patient reports that he is going through his "emotions". He is frustrated overall with his decision and regrets getting back with her. It only ended with the same results, and they got into habits of just fighting all over again. Patient again started having connectivity issues screen went blank and patient could not be heard or LCSW could not hear patient. Patient then disconnected from session, or the connection was lost. LCSW response length patient and waited 15 minutes before disconnecting.  Intervention/plan: LCSW delay psychoanalytic therapy for patient to express thoughts, feelings and emotions using  nonjudgmental environment. LCSW utilized supportive therapy for praise and encouragement. LCSW utilized motivational interviewing for open-ended questions and reflective listening.  Plan: Pt does not have a f/u as he disconnected and did not rejoin session.   Diagnosis: Mood disorder (HCC)  Marijuana abuse  Collaboration of Care: Other None today   Patient/Guardian was advised Release of Information must be obtained prior to any record release in order to collaborate their care with an outside provider. Patient/Guardian was advised if they have not already done so to contact the registration department to sign all necessary forms in order for [place] to release information regarding their care.   Consent: Patient/Guardian gives verbal consent for treatment and assignment of benefits for services provided during this visit. Patient/Guardian expressed understanding and agreed to proceed.   Tyler Cooks, LCSW 02/02/2024

## 2024-02-02 NOTE — Patient Instructions (Signed)
 v

## 2024-03-09 ENCOUNTER — Ambulatory Visit (HOSPITAL_COMMUNITY): Admitting: Licensed Clinical Social Worker

## 2024-03-09 ENCOUNTER — Telehealth (HOSPITAL_COMMUNITY): Payer: Self-pay

## 2024-03-09 DIAGNOSIS — F1994 Other psychoactive substance use, unspecified with psychoactive substance-induced mood disorder: Secondary | ICD-10-CM | POA: Insufficient documentation

## 2024-03-09 NOTE — Progress Notes (Signed)
 THERAPIST PROGRESS NOTE  Session Time: 45   Participation Level: Active  Behavioral Response: CasualAlertAngry, Anxious, Dysphoric, and Irritable  Type of Therapy: Individual Therapy  Treatment Goals addressed:  Active     OP Depression     LTG: Reduce frequency, intensity, and duration of depression symptoms so that daily functioning is improved (Progressing)     Start:  11/24/23    Expected End:  05/26/24         LTG: Increase coping skills to manage depression and improve ability to perform daily activities (Progressing)     Start:  11/24/23    Expected End:  05/26/24         STG: Luay will participate in at least 80% of scheduled individual psychotherapy sessions  (Progressing)     Start:  11/24/23    Expected End:  05/26/24         Identify 3 triggers for depression (Progressing)     Start:  11/24/23    Expected End:  05/26/24         STG: Identify 3 coping skills for depression (Not Progressing)     Start:  11/24/23    Expected End:  05/26/24         Work with [employer] to track symptoms, triggers, and/or skill use through a mood chart, diary card, or journal     Start:  11/24/23         Encourage Kaymen to participate in recovery peer support activities weekly      Start:  11/24/23         Therapist will administer the PHQ-9 at weekly intervals for the next 8 weeks     Start:  11/24/23         Provide [name] and reading material on dissociation, its causes, and symptoms     Start:  11/24/23         Work with [name] to identify the major components of a recent episode of depression: physical symptoms, major thoughts and images, and major behaviors they experienced     Start:  11/24/23            ProgressTowards Goals: Not Progressing  Interventions: CBT, Motivational Interviewing, Supportive, and Reframing   Suicidal/Homicidal: Nowithout intent/plan  Therapist Response:     Patient was alert and  oriented x 5.  He was pleasant, cooperative, maintained good eye contact.  He engaged well in therapy session was dressed casually.  Patient presented today with depressed and anxious mood\affect.  Patient reports passive suicidal ideations over the past week.  He reports that he relapsed with alcohol as of May 1.  He reports family conflict as the primary stressor and not finding employment.  Patient reports that "I have been arguing with everybody".  LCSW spoke with patient today about resources for chemical dependency.  Patient was agreeable to chemical dependency counselor referral and medication management referral to [facility].  LCSW educated patient on the importance of sobriety to decrease irritability, depression, and anger.  LCSW educated patient on resources in [place]   for behavioral health urgent care at [address] for suicide prevention hotline for safety planning for suicidal ideations.  Patient was agreeable to resources.  LCSW use supportive therapy for praise and encouragement.  LCSW utilized supportive therapy for praise and encouragement   Plan: Referral to medication provider to help stabilize mood and help with alcohol craving. Pt to be referred to Chemical dependency  counselor.  Diagnosis: Substance induced mood disorder (HCC)  Collaboration of Care: Other None today   Patient/Guardian was advised Release of Information must be obtained prior to any record release in order to collaborate their care with an outside provider. Patient/Guardian was advised if they have not already done so to contact the registration department to sign all necessary forms in order for us  to release information regarding their care.   Consent: Patient/Guardian gives verbal consent for treatment and assignment of benefits for services provided during this visit. Patient/Guardian expressed understanding and agreed to proceed.   Maryagnes Small,  LCSW 03/09/2024

## 2024-03-09 NOTE — Telephone Encounter (Signed)
 Tyler Friendly, LCSW referred this pt to [facility].  This therapist calls to discuss it with him and reached voice mail.  Therapist left a HIPAA compliant voice mail requesting a return call.  Earnie Gola, LMFT, LCAS

## 2024-03-16 ENCOUNTER — Telehealth (HOSPITAL_COMMUNITY): Payer: Self-pay

## 2024-03-16 NOTE — Telephone Encounter (Signed)
 Tyler Patton calls the front desk and they transfer him to this therapist.  Therapist confirms his identify by obtaining two verifiers. Tyler Patton says he would like to be in the CDIOP.  Therapists tells him she will schedule him to come in so she can do a treatment plan.  Therapist offers appointments and he asked if he can come in on March 28, 2024. Therapist offers a 1:00pm appointment and he accepts.  Earnie Gola, MS, LMFT, LCAS

## 2024-03-28 ENCOUNTER — Encounter (HOSPITAL_COMMUNITY): Payer: Self-pay

## 2024-03-28 ENCOUNTER — Ambulatory Visit (INDEPENDENT_AMBULATORY_CARE_PROVIDER_SITE_OTHER)

## 2024-03-28 ENCOUNTER — Ambulatory Visit (HOSPITAL_COMMUNITY)

## 2024-03-28 DIAGNOSIS — F1220 Cannabis dependence, uncomplicated: Secondary | ICD-10-CM

## 2024-03-28 DIAGNOSIS — F1020 Alcohol dependence, uncomplicated: Secondary | ICD-10-CM
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
## 2024-04-12 ENCOUNTER — Ambulatory Visit (INDEPENDENT_AMBULATORY_CARE_PROVIDER_SITE_OTHER)

## 2024-04-12 DIAGNOSIS — F1020 Alcohol dependence, uncomplicated: Secondary | ICD-10-CM

## 2024-04-12 DIAGNOSIS — F1220 Cannabis dependence, uncomplicated: Secondary | ICD-10-CM

## 2024-04-12 DIAGNOSIS — F1994 Other psychoactive substance use, unspecified with psychoactive substance-induced mood disorder: Secondary | ICD-10-CM

## 2024-04-12 NOTE — Progress Notes (Signed)
 THERAPIST PROGRESS NOTE  Session Time: 3:00 pm  Virtual Visit via Video Note   I connected with Tyler Patton at  3: 00 pm EST by a video enabled telemedicine application and verified that I am speaking with the correct person using two identifiers.   Location: Patient: home Provider: [address] [place]   I discussed the limitations of evaluation and management by telemedicine and the availability of in person appointments. The patient expressed understanding and agreed to proceed.  Type of Therapy: Individual   Therapist Response/Interventions: Explained Post Acute Withdrawal and how the brain recovers when one stops using substances  Treatment Goals addressed: Problem: Substance Use Dates: Start: 03/28/24 Disciplines: Interdisciplinary, PROVIDER Goal: Tyler Patton will abstain from alcohol 30/30 days per months and consider reducing marijuana use. Dates: Start: 03/28/24 Expected End: 03/28/24 Disciplines: Interdisciplinary, PROVIDER Goal: Tyler Patton will decrease his anxiety and depression by reporting PHQ-9 and GAD-7 as no higher than 3. Dates: Start: 03/28/24 Expected End: 03/28/24 Disciplines: Interdisciplinary, PROVIDER Intervention: Therapists will educate Tyler Patton about SUDS, patterns and consequences of use, relapse risks, how to identify triggers Dates: Start: 03/28/24 Intervention: Therapist will assist Tyler Patton in identifying thoughts and behaviors taht can contribute to feelings of depression and anxiety.  Summary: I am doing ok. I had one episode and got to arguing with a friend and drank for two nights. This was shortly after  the last session.  He says he smoked two weeks after our session and then stopped. He say he can have it or not have it. Tyler Patton says he thinks marijuana is a habit, rather than an addiction.  He says he does not smoke marijuana on a regular basis. He says when he is active he does not smoke much. Therapist inquires what he likes to do when he is  active. 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  Divon inquires as to how his brain can heal from the use of substances.  Progress Towards Goals: progressing  Suicidal/Homicidal: denies  Plan: Next session will be on May 17, 2024 at 2pm.  Diagnosis: Alcohol Use Disorder, Severe,Cannabis use Disorder, Moderate    Collaboration of Care: n/a  Patient/Guardian was advised Release of Information must be obtained prior to any record release in order to collaborate their care with an outside provider. Patient/Guardian was advised if they have not already done so to contact the registration department to sign all necessary forms in order for us  to release information regarding their care.   Consent: Patient/Guardian gives verbal consent for treatment and assignment of benefits for services provided during this visit. Patient/Guardian expressed understanding and agreed to proceed.   Earnie Gola, MS, LMFT, LCAS
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
## 2024-05-12 ENCOUNTER — Ambulatory Visit (HOSPITAL_COMMUNITY): Admitting: Psychiatry

## 2024-05-12 ENCOUNTER — Encounter (HOSPITAL_COMMUNITY): Payer: Self-pay | Admitting: Psychiatry

## 2024-05-12 DIAGNOSIS — F319 Bipolar disorder, unspecified: Secondary | ICD-10-CM

## 2024-05-12 DIAGNOSIS — F1020 Alcohol dependence, uncomplicated: Secondary | ICD-10-CM

## 2024-05-12 DIAGNOSIS — F1220 Cannabis dependence, uncomplicated: Secondary | ICD-10-CM

## 2024-05-12 NOTE — Patient Instructions (Addendum)
 Thank you for attending your appointment today.  -- As discussed, you were not interested in medications today. If you change your mind, please let your therapist know or call the clinic.  Please do not make any changes to medications without first discussing with your provider. If you are experiencing a psychiatric emergency, please call 911 or present to your nearest emergency department. Additional crisis, medication management, and therapy resources are included below.  [facility]  [address] [place], [place] [address] [phone] WALK-IN URGENT CARE 24/7 FOR ANYONE [address], [place], [place]  [phone] Fax: [phone] [email or web address] *Interpreters available *Accepts all insurance and uninsured for Urgent Care needs *Accepts Medicaid and uninsured for outpatient treatment (below)      ONLY FOR [facility]  Below:    Outpatient New Patient Assessment/Therapy Walk-ins:        Monday, Wednesday, and Thursday 8am until slots are full (first come, first served)                   New Patient Psychiatry/Medication Management        Monday-Friday 8am-11am (first come, first served)               For all walk-ins we ask that you arrive by 7:15am, because patients will be seen in the order of arrival.

## 2024-05-17 ENCOUNTER — Ambulatory Visit (HOSPITAL_COMMUNITY)

## 2024-05-17 DIAGNOSIS — F1994 Other psychoactive substance use, unspecified with psychoactive substance-induced mood disorder: Secondary | ICD-10-CM

## 2024-05-17 DIAGNOSIS — F1220 Cannabis dependence, uncomplicated: Secondary | ICD-10-CM

## 2024-05-17 DIAGNOSIS — F1020 Alcohol dependence, uncomplicated: Secondary | ICD-10-CM
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
## 2024-06-28 ENCOUNTER — Encounter (HOSPITAL_COMMUNITY): Payer: Self-pay

## 2024-06-28 ENCOUNTER — Ambulatory Visit (HOSPITAL_COMMUNITY)

## 2024-06-28 NOTE — Progress Notes (Unsigned)
 THERAPIST PROGRESS NOTE  Session Time: 3:00 pm to 3:55pm  Virtual Visit via Video Note   I connected with Smitty at  2:02: 00 pm EST by a video enabled telemedicine application and verified that I am speaking with the correct person using two identifiers.   Location: Patient: home Provider: [address] [place]   I discussed the limitations of evaluation and management by telemedicine and the availability of in person appointments. The patient expressed understanding and agreed to proceed.  Type of Therapy: Individual   Therapist Response/Interventions: .  Treatment Goals addressed: Problem: Substance Use Dates: Start: 03/28/24 Disciplines: Interdisciplinary, PROVIDER Goal: Johntavious will abstain from alcohol 30/30 days per months and consider reducing marijuana use. Dates: Start: 03/28/24 Expected End: 03/28/24 Disciplines: Interdisciplinary, PROVIDER Goal: Clayden will decrease his anxiety and depression by reporting PHQ-9 and GAD-7 as no higher than 3. Dates: Start: 03/28/24 Expected End: 03/28/24 Disciplines: Interdisciplinary, PROVIDER Intervention: Therapists will educate [place]alaus about SUDS, patterns and consequences of use, relapse risks, how to identify triggers Dates: Start: 03/28/24 Intervention: Therapist will assist Razi in identifying thoughts and behaviors taht can contribute to feelings of depression and anxiety.  Summary: Perris presents virtually today    Progress Towards Goals: minimal progress  Suicidal/Homicidal: denies  Plan: Next session will be on 06-28-24 at 2:00 virtually  Diagnosis: Alcohol Use Disorder, Severe,Cannabis use Disorder, Moderate    Collaboration of Care: n/a  Patient/Guardian was advised Release of Information must be obtained prior to any record release in order to collaborate their care with an outside provider. Patient/Guardian was advised if they have not already done so to contact the registration department to sign  all necessary forms in order for us  to release information regarding their care.   Consent: Patient/Guardian gives verbal consent for treatment and assignment of benefits for services provided during this visit. Patient/Guardian expressed understanding and agreed to proceed.   Darice Simpler, MS, LMFT, LCAS

## 2024-11-03 ENCOUNTER — Ambulatory Visit: Payer: Self-pay | Admitting: Family Medicine

## 2024-11-10 ENCOUNTER — Ambulatory Visit (INDEPENDENT_AMBULATORY_CARE_PROVIDER_SITE_OTHER)

## 2024-11-10 DIAGNOSIS — F1020 Alcohol dependence, uncomplicated: Secondary | ICD-10-CM | POA: Diagnosis not present

## 2024-11-10 DIAGNOSIS — F1994 Other psychoactive substance use, unspecified with psychoactive substance-induced mood disorder: Secondary | ICD-10-CM

## 2024-11-10 DIAGNOSIS — F1220 Cannabis dependence, uncomplicated: Secondary | ICD-10-CM
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
## 2024-12-22 ENCOUNTER — Ambulatory Visit (HOSPITAL_COMMUNITY)
